# Patient Record
Sex: Male | Born: 1975 | Race: Black or African American | Hispanic: No | Marital: Single | Smoking: Current every day smoker
Health system: Southern US, Community
[De-identification: ages and names within clinical notes are randomized; demographics above are authoritative.]

---

## 1999-06-07 ENCOUNTER — Other Ambulatory Visit: Admission: RE | Admit: 1999-06-07 | Discharge: 1999-06-07 | Payer: Self-pay | Admitting: Orthopedic Surgery

## 2000-03-22 ENCOUNTER — Emergency Department (HOSPITAL_COMMUNITY): Admission: EM | Admit: 2000-03-22 | Discharge: 2000-03-22 | Payer: Self-pay | Admitting: Emergency Medicine

## 2001-01-27 ENCOUNTER — Emergency Department (HOSPITAL_COMMUNITY): Admission: EM | Admit: 2001-01-27 | Discharge: 2001-01-27 | Payer: Self-pay | Admitting: Emergency Medicine

## 2001-01-27 ENCOUNTER — Encounter: Payer: Self-pay | Admitting: Emergency Medicine

## 2001-01-29 ENCOUNTER — Encounter: Admission: RE | Admit: 2001-01-29 | Discharge: 2001-01-29 | Payer: Self-pay | Admitting: Specialist

## 2001-01-29 ENCOUNTER — Encounter: Payer: Self-pay | Admitting: Specialist

## 2009-06-30 ENCOUNTER — Emergency Department (HOSPITAL_COMMUNITY): Admission: EM | Admit: 2009-06-30 | Discharge: 2009-06-30 | Payer: Self-pay | Admitting: Emergency Medicine

## 2014-10-28 ENCOUNTER — Emergency Department (HOSPITAL_COMMUNITY): Payer: 59

## 2014-10-28 ENCOUNTER — Encounter (HOSPITAL_COMMUNITY): Payer: Self-pay

## 2014-10-28 ENCOUNTER — Emergency Department (HOSPITAL_COMMUNITY)
Admission: EM | Admit: 2014-10-28 | Discharge: 2014-10-28 | Disposition: A | Discharge status: Home / Self Care | Payer: 59 | Attending: Emergency Medicine | Admitting: Emergency Medicine

## 2014-10-28 DIAGNOSIS — Z720 Tobacco use: Secondary | ICD-10-CM | POA: Diagnosis not present

## 2014-10-28 DIAGNOSIS — K2970 Gastritis, unspecified, without bleeding: Secondary | ICD-10-CM | POA: Insufficient documentation

## 2014-10-28 DIAGNOSIS — R1084 Generalized abdominal pain: Secondary | ICD-10-CM | POA: Diagnosis present

## 2014-10-28 LAB — URINALYSIS, ROUTINE W REFLEX MICROSCOPIC
Bilirubin Urine: NEGATIVE
GLUCOSE, UA: NEGATIVE mg/dL
Ketones, ur: 40 mg/dL — AB
Nitrite: NEGATIVE
PH: 8.5 — AB (ref 5.0–8.0)
Protein, ur: 30 mg/dL — AB
SPECIFIC GRAVITY, URINE: 1.01 (ref 1.005–1.030)
Urobilinogen, UA: 0.2 mg/dL (ref 0.0–1.0)

## 2014-10-28 LAB — COMPREHENSIVE METABOLIC PANEL
ALBUMIN: 4.8 g/dL (ref 3.5–5.0)
ALK PHOS: 60 U/L (ref 38–126)
ALT: 14 U/L — ABNORMAL LOW (ref 17–63)
ANION GAP: 12 (ref 5–15)
AST: 31 U/L (ref 15–41)
BUN: 13 mg/dL (ref 6–20)
CALCIUM: 10 mg/dL (ref 8.9–10.3)
CO2: 23 mmol/L (ref 22–32)
Chloride: 104 mmol/L (ref 101–111)
Creatinine, Ser: 1.3 mg/dL — ABNORMAL HIGH (ref 0.61–1.24)
GFR calc Af Amer: >60 mL/min (ref >60)
GFR calc non Af Amer: >60 mL/min (ref >60)
GLUCOSE: 132 mg/dL — AB (ref 65–99)
Potassium: 3.8 mmol/L (ref 3.5–5.1)
SODIUM: 139 mmol/L (ref 135–145)
Total Bilirubin: 1 mg/dL (ref 0.3–1.2)
Total Protein: 8.1 g/dL (ref 6.5–8.1)

## 2014-10-28 LAB — CBC
HEMATOCRIT: 43.8 % (ref 39.0–52.0)
HEMOGLOBIN: 15.3 g/dL (ref 13.0–17.0)
MCH: 30 pg (ref 26.0–34.0)
MCHC: 34.9 g/dL (ref 30.0–36.0)
MCV: 85.9 fL (ref 78.0–100.0)
Platelets: 208 10*3/uL (ref 150–400)
RBC: 5.1 MIL/uL (ref 4.22–5.81)
RDW: 13.6 % (ref 11.5–15.5)
WBC: 10.5 10*3/uL (ref 4.0–10.5)

## 2014-10-28 LAB — LIPASE, BLOOD: Lipase: 18 U/L — ABNORMAL LOW (ref 22–51)

## 2014-10-28 LAB — URINE MICROSCOPIC-ADD ON

## 2014-10-28 MED ORDER — OMEPRAZOLE 20 MG PO CPDR: 20.0000 mg | DELAYED_RELEASE_CAPSULE | Freq: Every day | ORAL | Status: AC

## 2014-10-28 MED ORDER — ONDANSETRON 8 MG PO TBDP: ORAL_TABLET | ORAL | Status: DC

## 2014-10-28 MED ORDER — KETOROLAC TROMETHAMINE 30 MG/ML IJ SOLN
30.0000 mg | Freq: Once | INTRAMUSCULAR | Status: AC
  Administered 2014-10-28: 30 mg via INTRAVENOUS
  Filled 2014-10-28: qty 1

## 2014-10-28 MED ORDER — FENTANYL CITRATE (PF) 100 MCG/2ML IJ SOLN
50.0000 ug | Freq: Once | INTRAMUSCULAR | Status: AC
  Administered 2014-10-28: 50 ug via INTRAVENOUS
  Filled 2014-10-28: qty 2

## 2014-10-28 MED ORDER — ONDANSETRON HCL 4 MG/2ML IJ SOLN
4.0000 mg | Freq: Once | INTRAMUSCULAR | Status: AC
Start: 2014-10-28 — End: 2014-10-28
  Administered 2014-10-28: 4 mg via INTRAVENOUS
  Filled 2014-10-28: qty 2

## 2014-10-28 MED ORDER — GI COCKTAIL ~~LOC~~
30.0000 mL | Freq: Once | ORAL | Status: AC
  Administered 2014-10-28: 30 mL via ORAL
  Filled 2014-10-28: qty 30

## 2014-10-28 MED ORDER — SODIUM CHLORIDE 0.9 % IV SOLN
Freq: Once | INTRAVENOUS | Status: AC
  Administered 2014-10-28: 02:00:00 via INTRAVENOUS

## 2014-10-28 MED ORDER — DICYCLOMINE HCL 10 MG/ML IM SOLN
20.0000 mg | Freq: Once | INTRAMUSCULAR | Status: AC
  Administered 2014-10-28: 20 mg via INTRAMUSCULAR
  Filled 2014-10-28: qty 2

## 2014-10-28 NOTE — ED Provider Notes (Signed)
CSN: [id number]   Arrival date & time 10/28/14 0107  History  By signing my name below, I, Bethel Born, attest that this documentation has been prepared under the direction and in the presence of Zuzanna Maroney, MD. Electronically Signed: Bethel Born, ED Scribe. 10/28/2014. 2:42 AM.  Chief Complaint  Patient presents with  . Abdominal Pain    HPI Patient is a 39 y.o. male presenting with abdominal pain. The history is provided by the patient. No language interpreter was used.  Abdominal Pain Pain location:  Generalized Pain quality: cramping   Pain radiates to:  Does not radiate Pain severity:  Severe Onset quality:  Sudden Timing:  Constant Progression:  Improving Chronicity:  New Context: eating   Context: not diet changes, not laxative use, not recent illness, not recent travel and not sick contacts   Relieved by:  Liquids Worsened by:  Nothing tried Ineffective treatments:  None tried Associated symptoms: nausea and vomiting   Associated symptoms: no anorexia, no chest pain, no chills, no constipation, no cough, no diarrhea, no dysuria, no fever, no flatus, no hematemesis, no hematochezia and no hematuria   Risk factors: not elderly, has not had multiple surgeries, not obese and no recent hospitalization    Jeffrey Espinoza is a 39 y.o. male who presents to the Emergency Department complaining of constant diffuse abdominal pain with sudden onset after dinner this evening. He ate 2 cheeseburgers with raw onions for dinner. The pain improved with sipping water. Associated symptoms include nausea and vomiting. He had a normal bowel movement this evening. Pt denies diarrhea, constipation, dysuria. No one else who had the same meal is ill. No history of GERD, Chron's disease, or ulcers. No small children in the home.  History reviewed. No pertinent past medical history.  History reviewed. No pertinent past surgical history.  History reviewed. No pertinent family history.   Social History  Substance Use Topics  . Smoking status: Current Every Day Smoker  . Smokeless tobacco: None  . Alcohol Use: Yes     Review of Systems  Constitutional: Negative for fever and chills.  Respiratory: Negative for cough.   Cardiovascular: Negative for chest pain.  Gastrointestinal: Positive for nausea, vomiting and abdominal pain. Negative for diarrhea, constipation, hematochezia, anorexia, flatus and hematemesis.  Genitourinary: Negative for dysuria and hematuria.  Neurological: Negative for weakness.  All other systems reviewed and are negative.  Home Medications   Prior to Admission medications   Not on File    Allergies  Aspirin  Triage Vitals: BP 145/92 mmHg  Pulse 99  Temp(Src) 97.8 F (36.6 C) (Oral)  Resp 18  Ht  (1.727 m)  Wt 152 lb (68.947 kg)  BMI 23.12 kg/m2  SpO2 100%  Physical Exam  Constitutional: He is oriented to person, place, and time. He appears well-developed and well-nourished. No distress.  HENT:  Head: Normocephalic.  Mouth/Throat: Oropharynx is clear and moist.  Moist mucous membranes No exudate  Eyes: EOM are normal. Pupils are equal, round, and reactive to light.  Neck: Normal range of motion. Neck supple.  Trachea midline  Cardiovascular: Normal rate and regular rhythm.   Pulmonary/Chest: Effort normal and breath sounds normal. No respiratory distress. He has no wheezes. He has no rales.  CTAB  Abdominal: Soft. He exhibits no mass. There is no tenderness. There is no rebound, no guarding, no tenderness at McBurney's point and negative Murphy's sign.  Hyperactive bowel sounds  Musculoskeletal: Normal range of motion.  Neurological: He is  alert and oriented to person, place, and time.  Skin: Skin is warm and dry.  Psychiatric: He has a normal mood and affect. His behavior is normal.  Nursing note and vitals reviewed.   ED Course  Procedures   DIAGNOSTIC STUDIES: Oxygen Saturation is 100% on RA, normal by my  interpretation.    COORDINATION OF CARE: 2:33 AM Discussed treatment plan which includes lab work, anti emetic, and pain management with pt at bedside and pt agreed to plan.  Labs Reviewed  LIPASE, BLOOD - Abnormal; Notable for the following:    Lipase 18 (*)    All other components within normal limits  COMPREHENSIVE METABOLIC PANEL - Abnormal; Notable for the following:    Glucose, Bld 132 (*)    Creatinine, Ser 1.30 (*)    ALT 14 (*)    All other components within normal limits  CBC  URINALYSIS, ROUTINE W REFLEX MICROSCOPIC (NOT AT [facility])    Imaging Review No results found.    MDM   Final diagnoses:  None   Results for orders placed or performed during the hospital encounter of 10/28/14  Lipase, blood  Result Value Ref Range   Lipase 18 (L) 22 - 51 U/L  Comprehensive metabolic panel  Result Value Ref Range   Sodium 139 135 - 145 mmol/L   Potassium 3.8 3.5 - 5.1 mmol/L   Chloride 104 101 - 111 mmol/L   CO2 23 22 - 32 mmol/L   Glucose, Bld 132 (H) 65 - 99 mg/dL   BUN 13 6 - 20 mg/dL   Creatinine, Ser 1.61 (H) 0.61 - 1.24 mg/dL   Calcium 09.6 8.9 - 04.5 mg/dL   Total Protein 8.1 6.5 - 8.1 g/dL   Albumin 4.8 3.5 - 5.0 g/dL   AST 31 15 - 41 U/L   ALT 14 (L) 17 - 63 U/L   Alkaline Phosphatase 60 38 - 126 U/L   Total Bilirubin 1.0 0.3 - 1.2 mg/dL   GFR calc non Af Amer >60 >60 mL/min   GFR calc Af Amer >60 >60 mL/min   Anion gap 12 5 - 15  CBC  Result Value Ref Range   WBC 10.5 4.0 - 10.5 K/uL   RBC 5.10 4.22 - 5.81 MIL/uL   Hemoglobin 15.3 13.0 - 17.0 g/dL   HCT 40.9 81.1 - 91.4 %   MCV 85.9 78.0 - 100.0 fL   MCH 30.0 26.0 - 34.0 pg   MCHC 34.9 30.0 - 36.0 g/dL   RDW 78.2 95.6 - 21.3 %   Platelets 208 150 - 400 K/uL   Dg Abd Acute W/chest  10/28/2014   CLINICAL DATA:  Acute onset of lower abdominal pain and groin pain. Nausea and cramping. Initial encounter.  EXAM: DG ABDOMEN ACUTE W/ 1V CHEST  COMPARISON:  None.  FINDINGS: The lungs are well-aerated and  clear. There is no evidence of focal opacification, pleural effusion or pneumothorax. The cardiomediastinal silhouette is within normal limits.  The visualized bowel gas pattern is unremarkable. Scattered stool and air are seen within the colon; there is no evidence of small bowel dilatation to suggest obstruction. No free intra-abdominal air is identified on the provided upright view.  No acute osseous abnormalities are seen; the sacroiliac joints are unremarkable in appearance.  IMPRESSION: 1. Unremarkable bowel gas pattern; no free intra-abdominal air seen. 2. No acute cardiopulmonary process seen.   Electronically Signed   By: Roanna Raider M.D.   On: 10/28/2014 04:27  Medications  fentaNYL (SUBLIMAZE) injection 50 mcg (50 mcg Intravenous Given 10/28/14 0157)  ondansetron (ZOFRAN) injection 4 mg (4 mg Intravenous Given 10/28/14 0157)  0.9 %  sodium chloride infusion ( Intravenous Stopped 10/28/14 0339)  dicyclomine (BENTYL) injection 20 mg (20 mg Intramuscular Given 10/28/14 0333)  gi cocktail (Maalox,Lidocaine,Donnatal) (30 mLs Oral Given 10/28/14 0333)  ketorolac (TORADOL) 30 MG/ML injection 30 mg (30 mg Intravenous Given 10/28/14 0333)   Symptoms consistent with gas and gastritis induced by cheese burgers.  Pain relieved post medication.  Exam is benign .  No indication for advanced imaging.  Strict return precautions.    I, Jeffry Vogelsang-RASCH,Jaylah Goodlow K, personally performed the services described in this documentation. All medical record entries made by the scribe were at my direction and in my presence.  I have reviewed the chart and discharge instructions and agree that the record reflects my personal performance and is accurate and complete. Awesome Jared-RASCH,Casee Knepp K.  10/28/2014. 4:45 AM.     Stesha Neyens, MD 10/28/14 [id number]

## 2014-10-28 NOTE — ED Notes (Signed)
Pt complains of severe abd pain about one hour after dinner with vomiting

## 2014-10-28 NOTE — Discharge Instructions (Signed)

## 2014-10-29 ENCOUNTER — Emergency Department (HOSPITAL_COMMUNITY)
Admission: EM | Admit: 2014-10-29 | Discharge: 2014-10-29 | Disposition: A | Discharge status: Home / Self Care | Payer: 59 | Attending: Emergency Medicine | Admitting: Emergency Medicine

## 2014-10-29 ENCOUNTER — Encounter (HOSPITAL_COMMUNITY): Payer: Self-pay | Admitting: Emergency Medicine

## 2014-10-29 ENCOUNTER — Emergency Department (HOSPITAL_COMMUNITY): Payer: 59

## 2014-10-29 DIAGNOSIS — Z720 Tobacco use: Secondary | ICD-10-CM | POA: Diagnosis not present

## 2014-10-29 DIAGNOSIS — N201 Calculus of ureter: Secondary | ICD-10-CM | POA: Diagnosis not present

## 2014-10-29 DIAGNOSIS — R1011 Right upper quadrant pain: Secondary | ICD-10-CM | POA: Diagnosis present

## 2014-10-29 LAB — URINALYSIS, ROUTINE W REFLEX MICROSCOPIC
Bilirubin Urine: NEGATIVE
Glucose, UA: NEGATIVE mg/dL
Ketones, ur: 40 mg/dL — AB
NITRITE: NEGATIVE
PROTEIN: 30 mg/dL — AB
SPECIFIC GRAVITY, URINE: 1.015 (ref 1.005–1.030)
UROBILINOGEN UA: 1 mg/dL (ref 0.0–1.0)
pH: 8 (ref 5.0–8.0)

## 2014-10-29 LAB — URINE MICROSCOPIC-ADD ON

## 2014-10-29 LAB — CBC
HEMATOCRIT: 43.4 % (ref 39.0–52.0)
HEMOGLOBIN: 14.9 g/dL (ref 13.0–17.0)
MCH: 29.9 pg (ref 26.0–34.0)
MCHC: 34.3 g/dL (ref 30.0–36.0)
MCV: 87.1 fL (ref 78.0–100.0)
Platelets: 201 10*3/uL (ref 150–400)
RBC: 4.98 MIL/uL (ref 4.22–5.81)
RDW: 14 % (ref 11.5–15.5)
WBC: 12.5 10*3/uL — AB (ref 4.0–10.5)

## 2014-10-29 LAB — COMPREHENSIVE METABOLIC PANEL
ALBUMIN: 4.6 g/dL (ref 3.5–5.0)
ALT: 18 U/L (ref 17–63)
ANION GAP: 9 (ref 5–15)
AST: 41 U/L (ref 15–41)
Alkaline Phosphatase: 55 U/L (ref 38–126)
BILIRUBIN TOTAL: 0.8 mg/dL (ref 0.3–1.2)
BUN: 14 mg/dL (ref 6–20)
CO2: 27 mmol/L (ref 22–32)
Calcium: 9.3 mg/dL (ref 8.9–10.3)
Chloride: 105 mmol/L (ref 101–111)
Creatinine, Ser: 1.51 mg/dL — ABNORMAL HIGH (ref 0.61–1.24)
GFR, EST NON AFRICAN AMERICAN: 56 mL/min — AB (ref >60)
GLUCOSE: 113 mg/dL — AB (ref 65–99)
POTASSIUM: 3.9 mmol/L (ref 3.5–5.1)
Sodium: 141 mmol/L (ref 135–145)
TOTAL PROTEIN: 7.9 g/dL (ref 6.5–8.1)

## 2014-10-29 LAB — LIPASE, BLOOD: Lipase: 19 U/L — ABNORMAL LOW (ref 22–51)

## 2014-10-29 MED ORDER — ONDANSETRON HCL 4 MG/2ML IJ SOLN
4.0000 mg | Freq: Once | INTRAMUSCULAR | Status: AC
  Administered 2014-10-29: 4 mg via INTRAVENOUS
  Filled 2014-10-29: qty 2

## 2014-10-29 MED ORDER — OXYCODONE-ACETAMINOPHEN 5-325 MG PO TABS: 1.0000 | ORAL_TABLET | Freq: Four times a day (QID) | ORAL | Status: DC | PRN

## 2014-10-29 MED ORDER — ONDANSETRON HCL 4 MG/2ML IJ SOLN
4.0000 mg | Freq: Once | INTRAMUSCULAR | Status: DC
  Filled 2014-10-29: qty 2

## 2014-10-29 MED ORDER — SODIUM CHLORIDE 0.9 % IV SOLN
1000.0000 mL | INTRAVENOUS | Status: DC
  Administered 2014-10-29: 1000 mL via INTRAVENOUS

## 2014-10-29 MED ORDER — SODIUM CHLORIDE 0.9 % IV SOLN
1000.0000 mL | Freq: Once | INTRAVENOUS | Status: AC
Start: 2014-10-29 — End: 2014-10-29
  Administered 2014-10-29: 1000 mL via INTRAVENOUS

## 2014-10-29 MED ORDER — HYDROMORPHONE HCL 1 MG/ML IJ SOLN
1.0000 mg | INTRAMUSCULAR | Status: DC | PRN
Start: 2014-10-29 — End: 2014-10-30
  Administered 2014-10-29 (×2): 1 mg via INTRAVENOUS
  Filled 2014-10-29 (×3): qty 1

## 2014-10-29 MED ORDER — KETOROLAC TROMETHAMINE 30 MG/ML IJ SOLN
30.0000 mg | Freq: Once | INTRAMUSCULAR | Status: DC
  Filled 2014-10-29 (×2): qty 1

## 2014-10-29 NOTE — ED Provider Notes (Addendum)
CSN: [id number]     Arrival date & time 10/29/14  1713 History   First MD Initiated Contact with Patient 10/29/14 1816     Chief Complaint  Patient presents with  . Abdominal Pain  . Emesis  . Nausea   HPI Comments: Sx started Tuesday.  He was seen in the ED yesterday.   Labs noted blood in the urine.  Pt was feeling fine but then today in the afternoon the pain suddenly returned again.  Patient is a 39 y.o. male presenting with abdominal pain and vomiting. The history is provided by the patient.  Abdominal Pain Pain location:  RUQ Pain quality: sharp   Pain radiates to:  R flank Onset quality:  Sudden Duration:  2 days Timing:  Intermittent Chronicity:  Recurrent Relieved by:  Nothing Ineffective treatments:  None tried Associated symptoms: hematuria, nausea and vomiting   Associated symptoms: no cough, no diarrhea, no dysuria and no fever   Emesis Associated symptoms: abdominal pain   Associated symptoms: no diarrhea     History reviewed. No pertinent past medical history. History reviewed. No pertinent past surgical history. History reviewed. No pertinent family history. Social History  Substance Use Topics  . Smoking status: Current Every Day Smoker  . Smokeless tobacco: None  . Alcohol Use: Yes    Review of Systems  Constitutional: Negative for fever.  Respiratory: Negative for cough.   Gastrointestinal: Positive for nausea, vomiting and abdominal pain. Negative for diarrhea.  Genitourinary: Positive for hematuria. Negative for dysuria.  All other systems reviewed and are negative.     Allergies  Aspirin  Home Medications   Prior to Admission medications   Medication Sig Start Date End Date Taking? Authorizing Provider  omeprazole (PRILOSEC) 20 MG capsule Take 1 capsule (20 mg total) by mouth daily. 10/28/14  Yes April Palumbo, MD  ondansetron (ZOFRAN ODT) 8 MG disintegrating tablet 8mg  ODT q8 hours prn nausea Patient taking differently: Take 8 mg by mouth  every 8 (eight) hours as needed for nausea. 8mg  ODT q8 hours prn nausea 10/28/14  Yes April Palumbo, MD  tetrahydrozoline 0.05 % ophthalmic solution Place 1 drop into both eyes 4 (four) times daily as needed (for eye irritation).   Yes Historical Provider, MD  oxyCODONE-acetaminophen (PERCOCET/ROXICET) 5-325 MG tablet Take 1-2 tablets by mouth every 6 (six) hours as needed. 10/29/14   Linwood Dibbles, MD   BP 140/78 mmHg  Pulse 81  Temp(Src) 97.5 F (36.4 C) (Oral)  Resp 18  SpO2 94% Physical Exam  Constitutional: He appears well-developed and well-nourished.  HENT:  Head: Normocephalic and atraumatic.  Right Ear: External ear normal.  Left Ear: External ear normal.  Eyes: Conjunctivae are normal. Right eye exhibits no discharge. Left eye exhibits no discharge. No scleral icterus.  Neck: Neck supple. No tracheal deviation present.  Cardiovascular: Normal rate, regular rhythm and intact distal pulses.   Pulmonary/Chest: Effort normal and breath sounds normal. No stridor. No respiratory distress. He has no wheezes. He has no rales.  Abdominal: Soft. Bowel sounds are normal. He exhibits no distension. There is tenderness in the right upper quadrant. There is CVA tenderness. There is no rebound and no guarding.  Musculoskeletal: He exhibits no edema or tenderness.  Neurological: He is alert. He has normal strength. No cranial nerve deficit (no facial droop, extraocular movements intact, no slurred speech) or sensory deficit. He exhibits normal muscle tone. He displays no seizure activity. Coordination normal.  Skin: Skin is warm and dry. No  rash noted.  Psychiatric: He has a normal mood and affect.  Nursing note and vitals reviewed.   ED Course  Procedures (including critical care time) Labs Review Labs Reviewed  LIPASE, BLOOD - Abnormal; Notable for the following:    Lipase 19 (*)    All other components within normal limits  COMPREHENSIVE METABOLIC PANEL - Abnormal; Notable for the  following:    Glucose, Bld 113 (*)    Creatinine, Ser 1.51 (*)    GFR calc non Af Amer 56 (*)    All other components within normal limits  CBC - Abnormal; Notable for the following:    WBC 12.5 (*)    All other components within normal limits  URINALYSIS, ROUTINE W REFLEX MICROSCOPIC (NOT AT [facility]) - Abnormal; Notable for the following:    Color, Urine RED (*)    APPearance CLOUDY (*)    Hgb urine dipstick LARGE (*)    Ketones, ur 40 (*)    Protein, ur 30 (*)    Leukocytes, UA TRACE (*)    All other components within normal limits  URINE MICROSCOPIC-ADD ON - Abnormal; Notable for the following:    Bacteria, UA MANY (*)    All other components within normal limits    Imaging Review Ct Abdomen Pelvis Wo Contrast  10/29/2014   CLINICAL DATA:  Right lower quadrant pain, nausea, vomiting, recurrent  EXAM: CT ABDOMEN AND PELVIS WITHOUT CONTRAST  TECHNIQUE: Multidetector CT imaging of the abdomen and pelvis was performed following the standard protocol without IV contrast.  COMPARISON:  None.  FINDINGS: Minimal dependent atelectasis posteriorly in the visualized lung bases.  No nephrolithiasis. There is moderate right hydronephrosis and proximal ureterectasis. There is a moderate amount of retroperitoneal fluid around the proximal and mid right ureter suggesting forniceal rupture. 4 mm calculus at the right ureteral orifice.  Unremarkable left kidney, adrenal glands, pancreas, gallbladder, liver, spleen. Unenhanced CT was performed per clinician order. Lack of IV contrast limits sensitivity and specificity, especially for evaluation of abdominal/pelvic solid viscera.  Stomach, small bowel, and colon are nondilated. Urinary bladder incompletely distended. Bilateral pelvic phleboliths. No ascites. No free air. No adenopathy localized. Lumbar spine intact.  IMPRESSION: 1. 4 mm distal right ureteral calculus with moderate retroperitoneal fluid around the proximal and mid right ureter suggesting renal  forniceal rupture. Recommend urologic consultation.   Electronically Signed   By: Corlis Leak M.D.   On: 10/29/2014 20:14   Dg Abd Acute W/chest  10/28/2014   CLINICAL DATA:  Acute onset of lower abdominal pain and groin pain. Nausea and cramping. Initial encounter.  EXAM: DG ABDOMEN ACUTE W/ 1V CHEST  COMPARISON:  None.  FINDINGS: The lungs are well-aerated and clear. There is no evidence of focal opacification, pleural effusion or pneumothorax. The cardiomediastinal silhouette is within normal limits.  The visualized bowel gas pattern is unremarkable. Scattered stool and air are seen within the colon; there is no evidence of small bowel dilatation to suggest obstruction. No free intra-abdominal air is identified on the provided upright view.  No acute osseous abnormalities are seen; the sacroiliac joints are unremarkable in appearance.  IMPRESSION: 1. Unremarkable bowel gas pattern; no free intra-abdominal air seen. 2. No acute cardiopulmonary process seen.   Electronically Signed   By: Roanna Raider M.D.   On: 10/28/2014 04:27   I have personally reviewed and evaluated these images and lab results as part of my medical decision-making.  Medications  0.9 %  sodium chloride infusion (1,000  mLs Intravenous New Bag/Given 10/29/14 1910)    Followed by  0.9 %  sodium chloride infusion (1,000 mLs Intravenous New Bag/Given 10/29/14 1910)  HYDROmorphone (DILAUDID) injection 1 mg (1 mg Intravenous Given 10/29/14 2028)  ketorolac (TORADOL) 30 MG/ML injection 30 mg (not administered)  ondansetron (ZOFRAN) injection 4 mg (not administered)  ondansetron (ZOFRAN) injection 4 mg (4 mg Intravenous Given 10/29/14 1909)     MDM   Final diagnoses:  Ureteral stone     the patient's  Symptoms improved with treatment in the emergency department.   I reviewed the CT scan findings with Dr. Ronne Binning , urology.   He recommends symptomatic treatment and outpatient follow-up. I discussed the findings and plan with the  patient and his wife. He understands return to the emergency room for worsening symptoms or fever.    Linwood Dibbles, MD 10/29/14 2305  Added PE exam that was omitted initially  Linwood Dibbles, MD 12/01/14 2239

## 2014-10-29 NOTE — Discharge Instructions (Signed)
Kidney Stones °Kidney stones (urolithiasis) are deposits that form inside your kidneys. The intense pain is caused by the stone moving through the urinary tract. When the stone moves, the ureter goes into spasm around the stone. The stone is usually passed in the urine.  °CAUSES  °· A disorder that makes certain neck glands produce too much parathyroid hormone (primary hyperparathyroidism). °· A buildup of uric acid crystals, similar to gout in your joints. °· Narrowing (stricture) of the ureter. °· A kidney obstruction present at birth (congenital obstruction). °· Previous surgery on the kidney or ureters. °· Numerous kidney infections. °SYMPTOMS  °· Feeling sick to your stomach (nauseous). °· Throwing up (vomiting). °· Blood in the urine (hematuria). °· Pain that usually spreads (radiates) to the groin. °· Frequency or urgency of urination. °DIAGNOSIS  °· Taking a history and physical exam. °· Blood or urine tests. °· CT scan. °· Occasionally, an examination of the inside of the urinary bladder (cystoscopy) is performed. °TREATMENT  °· Observation. °· Increasing your fluid intake. °· Extracorporeal shock wave lithotripsy--This is a noninvasive procedure that uses shock waves to break up kidney stones. °· Surgery may be needed if you have severe pain or persistent obstruction. There are various surgical procedures. Most of the procedures are performed with the use of small instruments. Only small incisions are needed to accommodate these instruments, so recovery time is minimized. °The size, location, and chemical composition are all important variables that will determine the proper choice of action for you. Talk to your health care provider to better understand your situation so that you will minimize the risk of injury to yourself and your kidney.  °HOME CARE INSTRUCTIONS  °· Drink enough water and fluids to keep your urine clear or pale yellow. This will help you to pass the stone or stone fragments. °· Strain  all urine through the provided strainer. Keep all particulate matter and stones for your health care provider to see. The stone causing the pain may be as small as a grain of salt. It is very important to use the strainer each and every time you pass your urine. The collection of your stone will allow your health care provider to analyze it and verify that a stone has actually passed. The stone analysis will often identify what you can do to reduce the incidence of recurrences. °· Only take over-the-counter or prescription medicines for pain, discomfort, or fever as directed by your health care provider. °· Keep all follow-up visits as told by your health care provider. This is important. °· Get follow-up X-rays if required. The absence of pain does not always mean that the stone has passed. It may have only stopped moving. If the urine remains completely obstructed, it can cause loss of kidney function or even complete destruction of the kidney. It is your responsibility to make sure X-rays and follow-ups are completed. Ultrasounds of the kidney can show blockages and the status of the kidney. Ultrasounds are not associated with any radiation and can be performed easily in a matter of minutes. °· Make changes to your daily diet as told by your health care provider. You may be told to: °¨ Limit the amount of salt that you eat. °¨ Eat 5 or more servings of fruits and vegetables each day. °¨ Limit the amount of meat, poultry, fish, and eggs that you eat. °· Collect a 24-hour urine sample as told by your health care provider. You may need to collect another urine sample every 6-12   months. °SEEK MEDICAL CARE IF: °· You experience pain that is progressive and unresponsive to any pain medicine you have been prescribed. °SEEK IMMEDIATE MEDICAL CARE IF:  °· Pain cannot be controlled with the prescribed medicine. °· You have a fever or shaking chills. °· The severity or intensity of pain increases over 18 hours and is not  relieved by pain medicine. °· You develop a new onset of abdominal pain. °· You feel faint or pass out. °· You are unable to urinate. °  °This information is not intended to replace advice given to you by your health care provider. Make sure you discuss any questions you have with your health care provider. °  °Document Released: 01/09/2005 Document Revised: 09/30/2014 Document Reviewed: 06/12/2012 °Elsevier Interactive Patient Education ©2016 [employer] ° °

## 2014-10-29 NOTE — Progress Notes (Signed)
Patient listed as having [employer] insurance without a pcp.  EDCM spoke to patient at bedside.  Patient sleeping at this time.  Patient's girlfriend at bedside.  EDCM instructed patient's girlfriend to have the patient call the phone number on the back of his insurance card to assist in in finding a pcp who is close to him and within network.  Patient's girlfriend verbalized understanding.  No further EDCm needs at this time.

## 2014-10-29 NOTE — ED Notes (Signed)
Pt complaining of RLQ abdominal pain, with nausea and vomiting. States he was seen here for this yesterday, felt better when he left and stated it got worse into today. Vitals stable in triage.

## 2014-10-29 NOTE — ED Notes (Addendum)
Patient called to secretary to request pain and nausea medication.  When this RN entered room, patient was sleeping with no obvious signs of pain.  Will return medications to pyxis and monitor for further need for pain and nausea medication.

## 2014-10-29 NOTE — ED Notes (Signed)
Patient states he was here yesterday and his urine was pink/red in color. States he went home and his urine "cleared up" and was yellow and clear. UA collected and sent. Pink/red in color, cloudy.

## 2016-09-15 IMAGING — CT CT ABD-PELV W/O CM
2 of 3 series · 16 of 32 positions shown, 18 images · non-contrast
Comparison: None.

CLINICAL DATA: Right lower quadrant pain, nausea, vomiting,
recurrent

EXAM:
CT ABDOMEN AND PELVIS WITHOUT CONTRAST
TECHNIQUE: Multidetector CT imaging of the abdomen and pelvis was performed
following the standard protocol without IV contrast.

[Series 3: coronal · coronal · 0.68mm/px · 3 of 121 slices shown]
[im 41/121  soft-tissue]
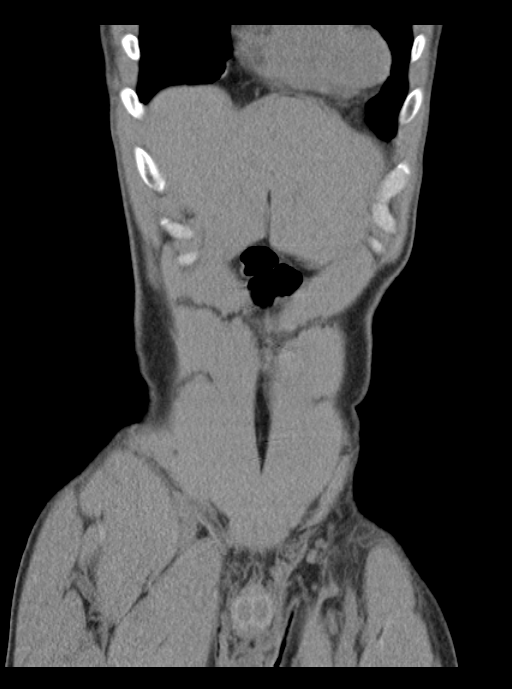
[im 54/121  soft-tissue]
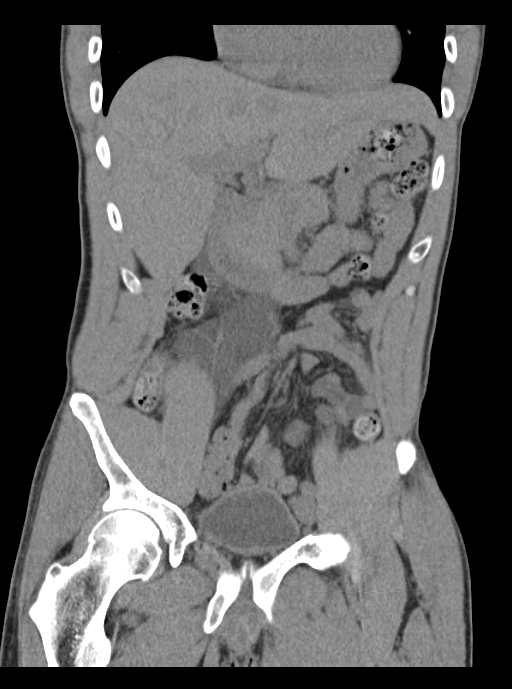
[im 67/121  soft-tissue]
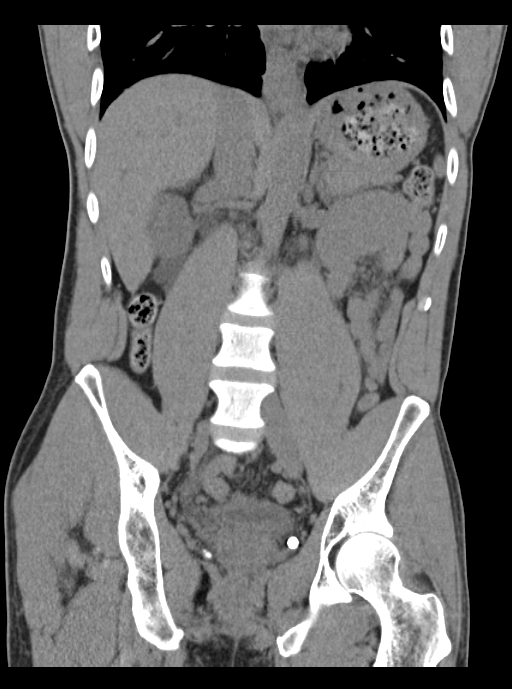

[Series 6: lung · axial · 0.74mm/px · z∈[+1098,+1173]mm · 13 of 18 slices shown, 15 images]
[im 2/18  soft-tissue]
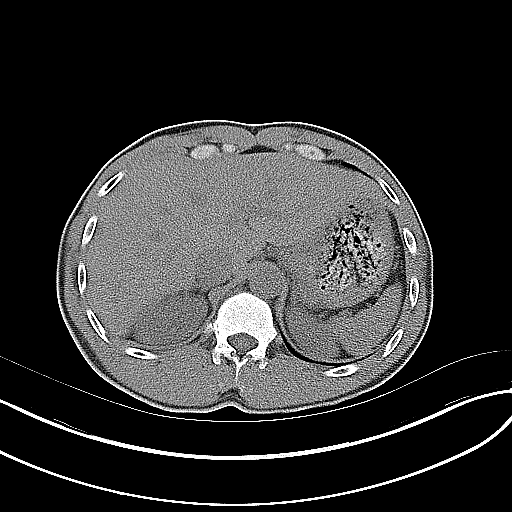
[im 2/18  bone]
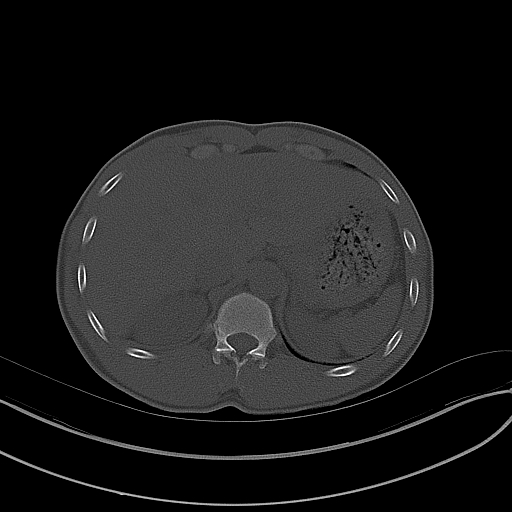
[im 3/18  soft-tissue]
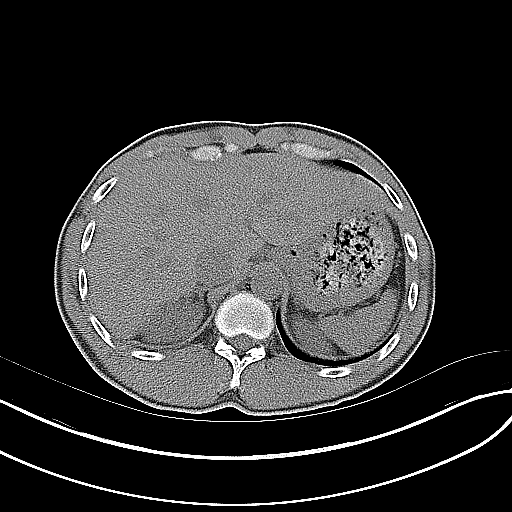
[im 5/18  soft-tissue]
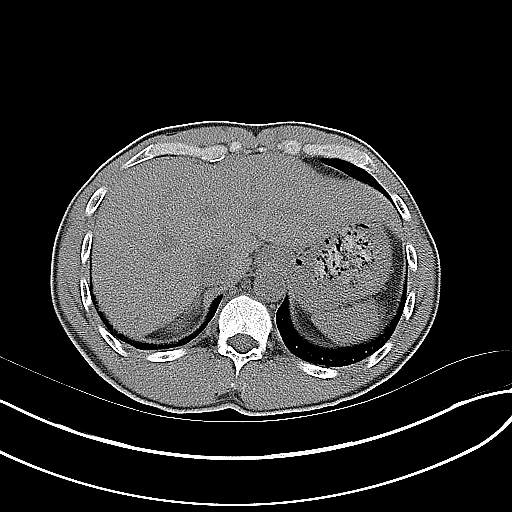
[im 6/18  soft-tissue]
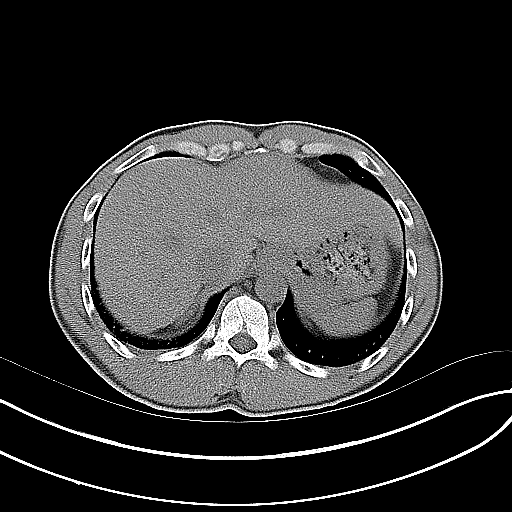
[im 7/18  soft-tissue]
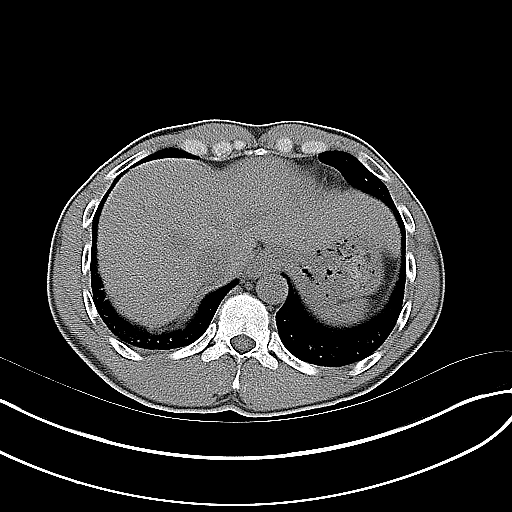
[im 8/18  soft-tissue]
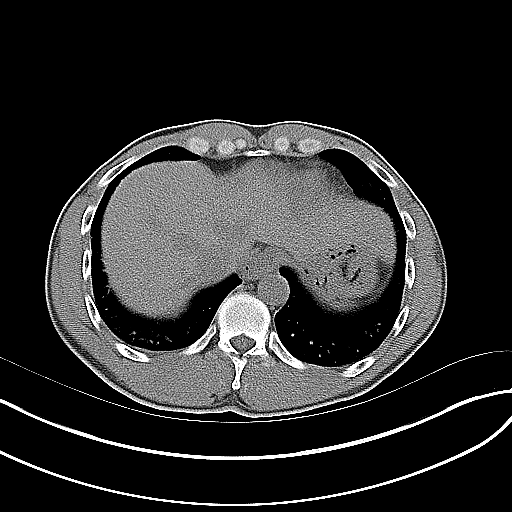
[im 10/18  soft-tissue]
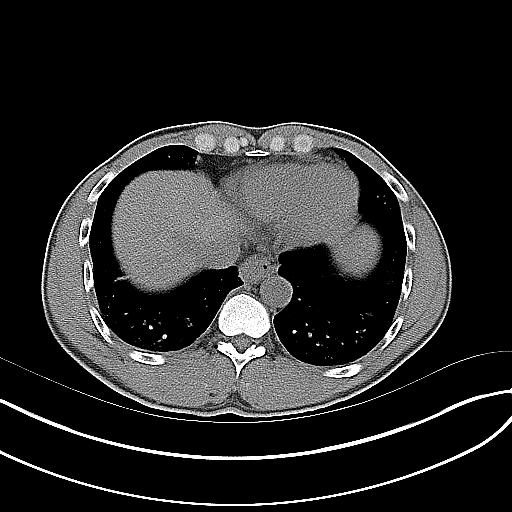
[im 11/18  soft-tissue]
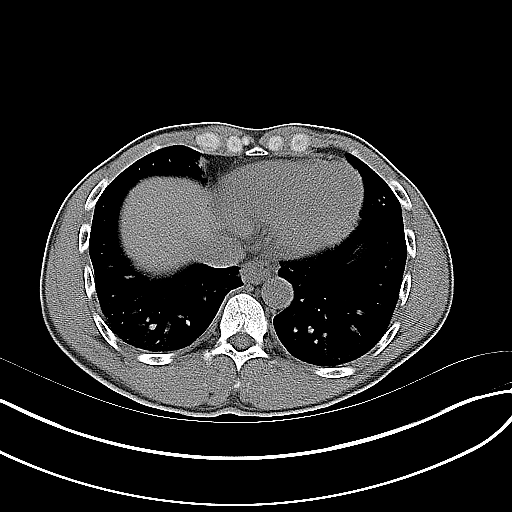
[im 12/18  soft-tissue]
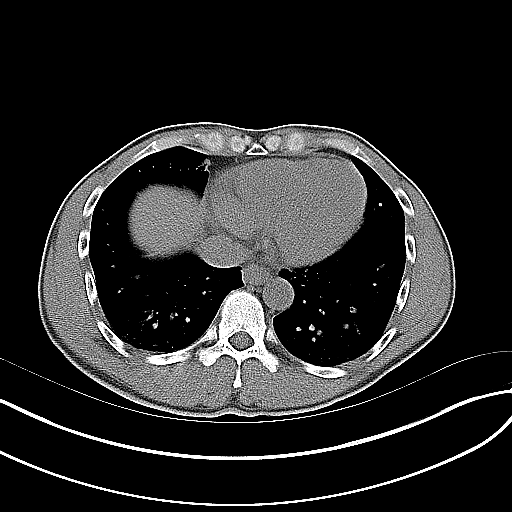
[im 12/18  bone]
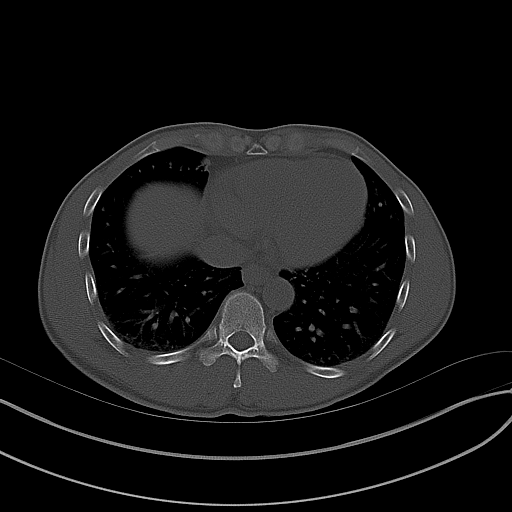
[im 13/18  soft-tissue]
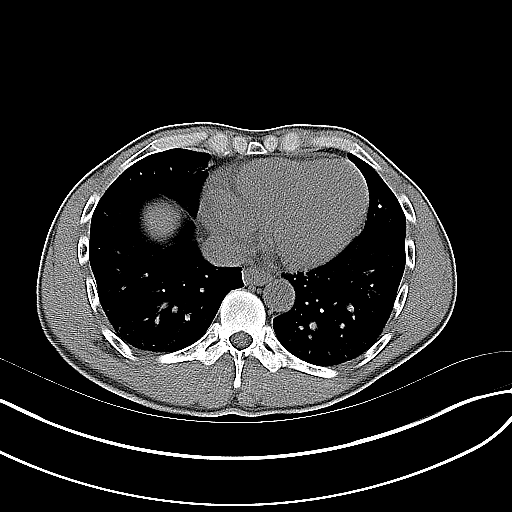
[im 14/18  soft-tissue]
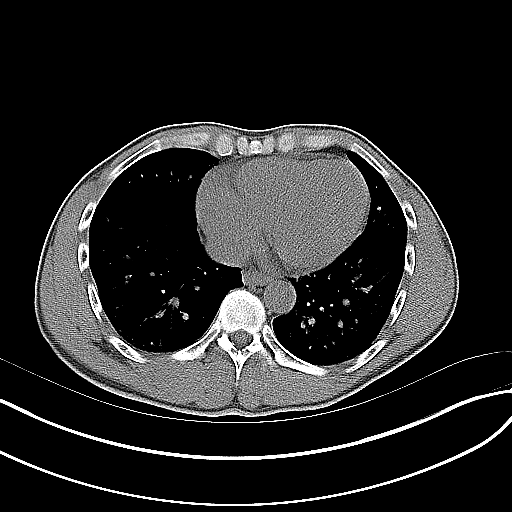
[im 16/18  soft-tissue]
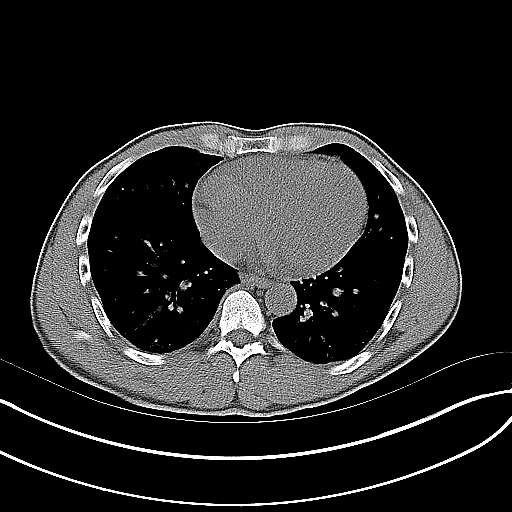
[im 17/18  soft-tissue]
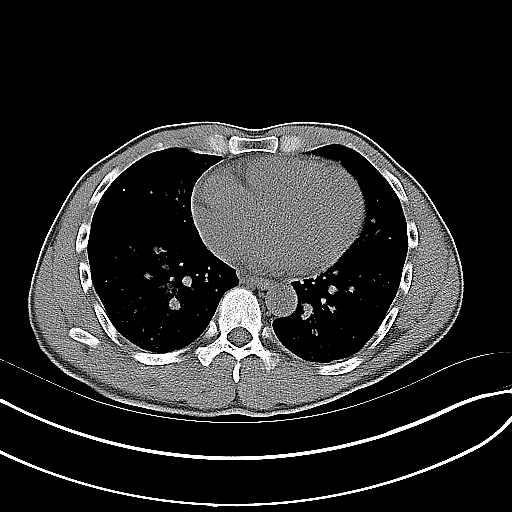

[16 of 32 positions shown; findings below may reference images not displayed]

FINDINGS: Minimal dependent atelectasis posteriorly in the visualized lung
bases.

No nephrolithiasis. There is moderate right hydronephrosis and
proximal ureterectasis. There is a moderate amount of
retroperitoneal fluid around the proximal and mid right ureter
suggesting forniceal rupture. 4 mm calculus at the right ureteral
orifice.

Unremarkable left kidney, adrenal glands, pancreas, gallbladder,
liver, spleen. Unenhanced CT was performed per clinician order. Lack
of IV contrast limits sensitivity and specificity, especially for
evaluation of abdominal/pelvic solid viscera.

Stomach, small bowel, and colon are nondilated. Urinary bladder
incompletely distended. Bilateral pelvic phleboliths. No ascites. No
free air. No adenopathy localized. Lumbar spine intact.
IMPRESSION: 1. 4 mm distal right ureteral calculus with moderate retroperitoneal
fluid around the proximal and mid right ureter suggesting renal
forniceal rupture. Recommend urologic consultation.

## 2016-10-03 ENCOUNTER — Emergency Department (HOSPITAL_COMMUNITY)
Admission: EM | Admit: 2016-10-03 | Discharge: 2016-10-03 | Disposition: A | Discharge status: Home / Self Care | Payer: 59 | Attending: Emergency Medicine | Admitting: Emergency Medicine

## 2016-10-03 ENCOUNTER — Encounter (HOSPITAL_COMMUNITY): Payer: Self-pay | Admitting: Family Medicine

## 2016-10-03 ENCOUNTER — Emergency Department (HOSPITAL_COMMUNITY): Payer: 59

## 2016-10-03 DIAGNOSIS — Y999 Unspecified external cause status: Secondary | ICD-10-CM | POA: Diagnosis not present

## 2016-10-03 DIAGNOSIS — Y929 Unspecified place or not applicable: Secondary | ICD-10-CM | POA: Diagnosis not present

## 2016-10-03 DIAGNOSIS — F17210 Nicotine dependence, cigarettes, uncomplicated: Secondary | ICD-10-CM | POA: Insufficient documentation

## 2016-10-03 DIAGNOSIS — Y939 Activity, unspecified: Secondary | ICD-10-CM | POA: Diagnosis not present

## 2016-10-03 DIAGNOSIS — S90112A Contusion of left great toe without damage to nail, initial encounter: Secondary | ICD-10-CM | POA: Insufficient documentation

## 2016-10-03 DIAGNOSIS — W208XXA Other cause of strike by thrown, projected or falling object, initial encounter: Secondary | ICD-10-CM | POA: Insufficient documentation

## 2016-10-03 DIAGNOSIS — Z79899 Other long term (current) drug therapy: Secondary | ICD-10-CM | POA: Insufficient documentation

## 2016-10-03 DIAGNOSIS — S99922A Unspecified injury of left foot, initial encounter: Secondary | ICD-10-CM | POA: Diagnosis present

## 2016-10-03 MED ORDER — TRAMADOL HCL 50 MG PO TABS: 50.0000 mg | ORAL_TABLET | Freq: Four times a day (QID) | ORAL | 0 refills | Status: AC | PRN

## 2016-10-03 MED ORDER — OXYCODONE-ACETAMINOPHEN 5-325 MG PO TABS
1.0000 | ORAL_TABLET | Freq: Once | ORAL | Status: AC
  Administered 2016-10-03: 1 via ORAL
  Filled 2016-10-03: qty 1

## 2016-10-03 NOTE — Discharge Instructions (Signed)
Do not drive while taking the narcotic pain medication as it will make you sleepy. You can take tylenol and ibuprofen in addition to the medication we give you.

## 2016-10-03 NOTE — ED Triage Notes (Signed)
Pt reports dropping a desk on his L great toe yesterday around 11am. Toe is reddened and bruised. A&O. Ambulatory.

## 2016-10-03 NOTE — ED Provider Notes (Signed)
[facility]-EMERGENCY DEPT Provider Note   CSN: [id number] Arrival date & time: 10/03/16  2040     History   Chief Complaint Chief Complaint  Patient presents with  . Toe Injury    HPI Jeffrey Espinoza is a 41 y.o. male who presents to the ED with toe pain. The pain is located in the left great toe. Patient reports that yesterday he dropped a desk on his toe. He has been elevating the area and applying ice without relief. He has not taken any pain medication. Patient denies any other injuries.   HPI  History reviewed. No pertinent past medical history.  There are no active problems to display for this patient.   History reviewed. No pertinent surgical history.     Home Medications    Prior to Admission medications   Medication Sig Start Date End Date Taking? Authorizing Provider  omeprazole (PRILOSEC) 20 MG capsule Take 1 capsule (20 mg total) by mouth daily. 10/28/14   Palumbo, April, MD  tetrahydrozoline 0.05 % ophthalmic solution Place 1 drop into both eyes 4 (four) times daily as needed (for eye irritation).    [provider]  traMADol (ULTRAM) 50 MG tablet Take 1 tablet (50 mg total) by mouth every 6 (six) hours as needed. 10/03/16   Janne Napoleon, NP    Family History History reviewed. No pertinent family history.  Social History Social History  Substance Use Topics  . Smoking status: Current Every Day Smoker    Packs/day: 0.50    Types: Cigarettes  . Smokeless tobacco: Never Used  . Alcohol use Yes     Allergies   Aspirin   Review of Systems Review of Systems  Musculoskeletal: Positive for arthralgias.       Left toe pain  Skin: Negative for wound.  All other systems reviewed and are negative.    Physical Exam Updated Vital Signs BP (!) 139/100 (BP Location: Right Arm)   Pulse 77   Temp 98.7 F (37.1 C) (Oral)   Resp 12   Ht  (1.727 m)   Wt 70.3 kg (155 lb)   SpO2 97%   BMI 23.57 kg/m   Physical Exam  Constitutional: He is  oriented to person, place, and time. He appears well-developed and well-nourished. No distress.  HENT:  Head: Normocephalic.  Eyes: EOM are normal.  Neck: Neck supple.  Cardiovascular: Normal rate.   Pulmonary/Chest: Effort normal.  Musculoskeletal:  Left great toe with swelling and tenderness. Pedal pulse 2+, adequate circulation. Limited range of motion of the left great toe due to swelling and pain.  Neurological: He is alert and oriented to person, place, and time. No cranial nerve deficit.  Skin: Skin is warm and dry.  Nursing note and vitals reviewed.    ED Treatments / Results  Labs (all labs ordered are listed, but only abnormal results are displayed) Labs Reviewed - No data to display  Radiology Dg Toe Great Left  Result Date: 10/03/2016 CLINICAL DATA:  Dropped heavy object on toe.  Injury EXAM: LEFT GREAT TOE COMPARISON:  None. FINDINGS: There is no evidence of fracture or dislocation. There is no evidence of arthropathy or other focal bone abnormality. Soft tissues are unremarkable. IMPRESSION: Negative. Electronically Signed   By: Marlan Palau M.D.   On: 10/03/2016 21:47    Procedures Procedures (including critical care time)  Medications Ordered in ED Medications  oxyCODONE-acetaminophen (PERCOCET/ROXICET) 5-325 MG per tablet 1 tablet (1 tablet Oral Given 10/03/16 2133)  41 y.o. male with left great toe swelling and pain stable for d/c without fracture or dislocation noted on x-ray. Buddy tape and post op shoe, ice, elevation and pain management. No focal neuro deficits.   Initial Impression / Assessment and Plan / ED Course  I have reviewed the triage vital signs and the nursing notes.   Final Clinical Impressions(s) / ED Diagnoses   Final diagnoses:  Contusion of left great toe without damage to nail, initial encounter    New Prescriptions New Prescriptions   TRAMADOL (ULTRAM) 50 MG TABLET    Take 1 tablet (50 mg total) by mouth every 6 (six) hours as  needed.     Kerrie Buffaloeese, Kei Mcelhiney [place], [place] 10/03/16 2207    Lorre NickAllen, Anthony, MD 10/05/16 [phone]

## 2016-10-03 NOTE — ED Triage Notes (Signed)
Patient reports he had a desk fall on his left big toe while trying to move it yesterday evening.
